# Patient Record
Sex: Female | Born: 1990 | Race: Black or African American | Hispanic: No | Marital: Single | Smoking: Never smoker
Health system: Southern US, Community
[De-identification: ages and names within clinical notes are randomized; demographics above are authoritative.]

---
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
## 2013-11-17 LAB — HM HIV SCREENING LAB: HM HIV Screening: NEGATIVE

## 2015-11-11 ENCOUNTER — Encounter: Payer: Self-pay | Admitting: Emergency Medicine

## 2015-11-11 ENCOUNTER — Emergency Department
Admission: EM | Admit: 2015-11-11 | Discharge: 2015-11-11 | Disposition: A | Discharge status: Home / Self Care | Payer: Medicaid Other | Attending: Emergency Medicine | Admitting: Emergency Medicine

## 2015-11-11 DIAGNOSIS — O26892 Other specified pregnancy related conditions, second trimester: Secondary | ICD-10-CM | POA: Insufficient documentation

## 2015-11-11 DIAGNOSIS — Z3492 Encounter for supervision of normal pregnancy, unspecified, second trimester: Secondary | ICD-10-CM

## 2015-11-11 DIAGNOSIS — R1032 Left lower quadrant pain: Secondary | ICD-10-CM | POA: Insufficient documentation

## 2015-11-11 DIAGNOSIS — Z3A14 14 weeks gestation of pregnancy: Secondary | ICD-10-CM | POA: Insufficient documentation
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
## 2015-11-11 NOTE — Discharge Instructions (Signed)
Please follow up closely with obstetrics and gynecology or your primary doctor.  Return to the emergency room if you have vaginal bleeding, you become weak and dizzy or lightheaded, you have an episode of passing out, have abdominal pain, leak of fluid from the vaginal,, develop abdominal or pelvic pain, fevers chills or other new concerns arise.
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
## 2015-11-11 NOTE — ED Notes (Signed)
[phone]
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
## 2015-11-11 NOTE — ED Notes (Signed)
+   Fetal movement noted on ultrasound beside. HR 148

## 2016-07-25 LAB — HM PAP SMEAR: HM Pap smear: NEGATIVE

## 2017-04-28 ENCOUNTER — Other Ambulatory Visit: Payer: Self-pay

## 2017-04-28 ENCOUNTER — Encounter: Payer: Self-pay | Admitting: Emergency Medicine

## 2017-04-28 ENCOUNTER — Emergency Department: Payer: Medicaid Other

## 2017-04-28 ENCOUNTER — Emergency Department
Admission: EM | Admit: 2017-04-28 | Discharge: 2017-04-28 | Disposition: A | Discharge status: Home / Self Care | Payer: Medicaid Other | Attending: Emergency Medicine | Admitting: Emergency Medicine

## 2017-04-28 DIAGNOSIS — Y92410 Unspecified street and highway as the place of occurrence of the external cause: Secondary | ICD-10-CM | POA: Insufficient documentation

## 2017-04-28 DIAGNOSIS — Y9389 Activity, other specified: Secondary | ICD-10-CM | POA: Insufficient documentation

## 2017-04-28 DIAGNOSIS — Y998 Other external cause status: Secondary | ICD-10-CM | POA: Diagnosis not present

## 2017-04-28 DIAGNOSIS — S62611A Displaced fracture of proximal phalanx of left index finger, initial encounter for closed fracture: Secondary | ICD-10-CM | POA: Insufficient documentation

## 2017-04-28 DIAGNOSIS — S6992XA Unspecified injury of left wrist, hand and finger(s), initial encounter: Secondary | ICD-10-CM | POA: Diagnosis present

## 2017-04-28 MED ORDER — OXYCODONE-ACETAMINOPHEN 5-325 MG PO TABS
1.0000 | ORAL_TABLET | Freq: Once | ORAL | Status: AC
  Administered 2017-04-28: 1 via ORAL
  Filled 2017-04-28: qty 1

## 2017-04-28 MED ORDER — OXYCODONE-ACETAMINOPHEN 5-325 MG PO TABS: 1.0000 | ORAL_TABLET | Freq: Four times a day (QID) | ORAL | 0 refills | Status: AC | PRN
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
## 2017-04-29 DIAGNOSIS — S62611A Displaced fracture of proximal phalanx of left index finger, initial encounter for closed fracture: Secondary | ICD-10-CM | POA: Diagnosis not present

## 2017-08-06 DIAGNOSIS — Z0000 Encounter for general adult medical examination without abnormal findings: Secondary | ICD-10-CM | POA: Diagnosis not present

## 2017-08-06 DIAGNOSIS — Z3009 Encounter for other general counseling and advice on contraception: Secondary | ICD-10-CM | POA: Diagnosis not present

## 2017-08-06 DIAGNOSIS — Z3049 Encounter for surveillance of other contraceptives: Secondary | ICD-10-CM | POA: Diagnosis not present

## 2017-08-06 DIAGNOSIS — Z30013 Encounter for initial prescription of injectable contraceptive: Secondary | ICD-10-CM | POA: Diagnosis not present

## 2017-11-11 DIAGNOSIS — Z30013 Encounter for initial prescription of injectable contraceptive: Secondary | ICD-10-CM | POA: Diagnosis not present

## 2017-11-11 DIAGNOSIS — Z309 Encounter for contraceptive management, unspecified: Secondary | ICD-10-CM | POA: Diagnosis not present

## 2018-04-08 DIAGNOSIS — M7918 Myalgia, other site: Secondary | ICD-10-CM | POA: Diagnosis not present

## 2018-04-08 DIAGNOSIS — R1013 Epigastric pain: Secondary | ICD-10-CM | POA: Diagnosis not present

## 2018-04-15 ENCOUNTER — Emergency Department
Admission: EM | Admit: 2018-04-15 | Discharge: 2018-04-16 | Disposition: A | Discharge status: Home / Self Care | Payer: Medicaid Other | Attending: Emergency Medicine | Admitting: Emergency Medicine

## 2018-04-15 ENCOUNTER — Other Ambulatory Visit: Payer: Self-pay

## 2018-04-15 ENCOUNTER — Encounter: Payer: Self-pay | Admitting: *Deleted

## 2018-04-15 DIAGNOSIS — K529 Noninfective gastroenteritis and colitis, unspecified: Secondary | ICD-10-CM | POA: Insufficient documentation

## 2018-04-15 DIAGNOSIS — K6389 Other specified diseases of intestine: Secondary | ICD-10-CM | POA: Diagnosis not present

## 2018-04-15 DIAGNOSIS — R1084 Generalized abdominal pain: Secondary | ICD-10-CM | POA: Insufficient documentation

## 2018-04-15 DIAGNOSIS — R1013 Epigastric pain: Secondary | ICD-10-CM | POA: Diagnosis present

## 2018-04-15 LAB — COMPREHENSIVE METABOLIC PANEL
ALBUMIN: 3.7 g/dL (ref 3.5–5.0)
ALK PHOS: 44 U/L (ref 38–126)
ALT: 11 U/L (ref 0–44)
AST: 17 U/L (ref 15–41)
Anion gap: 10 (ref 5–15)
BUN: 10 mg/dL (ref 6–20)
CALCIUM: 8.7 mg/dL — AB (ref 8.9–10.3)
CO2: 29 mmol/L (ref 22–32)
Chloride: 99 mmol/L (ref 98–111)
Creatinine, Ser: 0.68 mg/dL (ref 0.44–1.00)
GFR calc Af Amer: >60 mL/min (ref >60)
GFR calc non Af Amer: >60 mL/min (ref >60)
GLUCOSE: 112 mg/dL — AB (ref 70–99)
POTASSIUM: 3.4 mmol/L — AB (ref 3.5–5.1)
Sodium: 138 mmol/L (ref 135–145)
TOTAL PROTEIN: 8 g/dL (ref 6.5–8.1)

## 2018-04-15 LAB — CBC
HCT: 38.6 % (ref 36.0–46.0)
Hemoglobin: 12.5 g/dL (ref 12.0–15.0)
MCH: 27.5 pg (ref 26.0–34.0)
MCHC: 32.4 g/dL (ref 30.0–36.0)
MCV: 84.8 fL (ref 80.0–100.0)
NRBC: 0 % (ref 0.0–0.2)
PLATELETS: 359 10*3/uL (ref 150–400)
RBC: 4.55 MIL/uL (ref 3.87–5.11)
RDW: 13.3 % (ref 11.5–15.5)
WBC: 17.4 10*3/uL — ABNORMAL HIGH (ref 4.0–10.5)

## 2018-04-15 LAB — URINALYSIS, COMPLETE (UACMP) WITH MICROSCOPIC
BILIRUBIN URINE: NEGATIVE
Bacteria, UA: NONE SEEN
GLUCOSE, UA: NEGATIVE mg/dL
HGB URINE DIPSTICK: NEGATIVE
Ketones, ur: NEGATIVE mg/dL
NITRITE: NEGATIVE
PH: 8 (ref 5.0–8.0)
Protein, ur: 30 mg/dL — AB
SPECIFIC GRAVITY, URINE: 1.027 (ref 1.005–1.030)

## 2018-04-15 LAB — POCT PREGNANCY, URINE: Preg Test, Ur: NEGATIVE

## 2018-04-15 LAB — LIPASE, BLOOD: Lipase: 21 U/L (ref 11–51)

## 2018-04-15 MED ORDER — FENTANYL CITRATE (PF) 100 MCG/2ML IJ SOLN
50.0000 ug | Freq: Once | INTRAMUSCULAR | Status: AC
  Administered 2018-04-15: 50 ug via INTRAVENOUS
  Filled 2018-04-15: qty 2

## 2018-04-15 MED ORDER — IOHEXOL 240 MG/ML SOLN
50.0000 mL | Freq: Once | INTRAMUSCULAR | Status: AC | PRN
  Administered 2018-04-16: 50 mL via ORAL

## 2018-04-15 MED ORDER — ONDANSETRON HCL 4 MG/2ML IJ SOLN
4.0000 mg | Freq: Once | INTRAMUSCULAR | Status: AC
  Administered 2018-04-15: 4 mg via INTRAVENOUS
  Filled 2018-04-15: qty 2

## 2018-04-15 MED ORDER — SODIUM CHLORIDE 0.9 % IV BOLUS
1000.0000 mL | Freq: Once | INTRAVENOUS | Status: AC
Start: 2018-04-15 — End: 2018-04-16
  Administered 2018-04-15: 1000 mL via INTRAVENOUS

## 2018-04-15 MED ORDER — SODIUM CHLORIDE 0.9% FLUSH
3.0000 mL | Freq: Once | INTRAVENOUS | Status: AC
  Administered 2018-04-15: 3 mL via INTRAVENOUS

## 2018-04-15 NOTE — ED Triage Notes (Signed)
Pt to triage via wheelchair.  Pt has abd pain.  No n/v/d.  No back pain.  No vag bleeding.  No urinary sx. Pt tearful

## 2018-04-15 NOTE — ED Provider Notes (Signed)
[facility] Emergency Department Provider Note   ____________________________________________   First MD Initiated Contact with Patient 04/15/18 2314     (approximate)  I have reviewed the triage vital signs and the nursing notes.   HISTORY  Chief Complaint Abdominal Pain    HPI Catherine Watkins is a 28 y.o. female who presents to the ED from home with a chief complaint of abdominal pain.  Patient reports onset of epigastric pain 2.5 weeks ago.  Has had waxing/waning pain which is now diffuse, particularly in the lower quadrants.  Patient endorses chills, no fever.  Denies associated cough, congestion, chest pain, shortness of breath, nausea, vomiting, dysuria, diarrhea.  Denies vaginal bleeding.  Has had some vaginal discharge which she feels is not more than usual.  Presents tonight secondary to increased pain.  Has not been traveling nor exposed to persons diagnosed with coronavirus.  Denies recent trauma.     Past medical history None  There are no active problems to display for this patient.   Past surgical history Finger surgery  Prior to Admission medications   Not on File    Allergies Patient has no known allergies.  No family history on file.  Social History Social History   Tobacco Use   Smoking status: Never Smoker   Smokeless tobacco: Never Used  Substance Use Topics   Alcohol use: Yes    Frequency: Never   Drug use: Never    Review of Systems  Constitutional: No fever/chills Eyes: No visual changes. ENT: No sore throat. Cardiovascular: Denies chest pain. Respiratory: Denies shortness of breath. Gastrointestinal: Positive for abdominal pain.  No nausea, no vomiting.  No diarrhea.  No constipation. Genitourinary: Negative for dysuria. Musculoskeletal: Negative for back pain. Skin: Negative for rash. Neurological: Negative for headaches, focal weakness or  numbness.   ____________________________________________   PHYSICAL EXAM:  VITAL SIGNS: ED Triage Vitals  Enc Vitals Group     BP 04/15/18 2159 129/87     Pulse Rate 04/15/18 2159 90     Resp 04/15/18 2159 (!) 22     Temp 04/15/18 2159 100 F (37.8 C)     Temp Source 04/15/18 2159 Oral     SpO2 04/15/18 2159 100 %     Weight 04/15/18 2200 124 lb (56.2 kg)     Height 04/15/18 2200 5\' 9"  (1.753 m)     Head Circumference --      Peak Flow --      Pain Score 04/15/18 2159 10     Pain Loc --      Pain Edu? --      Excl. in GC? --     Constitutional: Alert and oriented. Well appearing and in mild to moderate acute distress. Eyes: Conjunctivae are normal. PERRL. EOMI. Head: Atraumatic. Nose: No congestion/rhinnorhea. Mouth/Throat: Mucous membranes are moist.  Oropharynx non-erythematous. Neck: No stridor.   Cardiovascular: Normal rate, regular rhythm. Grossly normal heart sounds.  Good peripheral circulation. Respiratory: Normal respiratory effort.  No retractions. Lungs CTAB. Gastrointestinal: Soft and diffusely tender to palpation with voluntary guarding, no rebound. No distention. No abdominal bruits. No CVA tenderness. Musculoskeletal: No lower extremity tenderness nor edema.  No joint effusions. Neurologic:  Normal speech and language. No gross focal neurologic deficits are appreciated. No gait instability. Skin:  Skin is warm, dry and intact. No rash noted.  No petechiae. Psychiatric: Mood and affect are normal. Speech and behavior are normal.  ____________________________________________   LABS (all labs ordered  are listed, but only abnormal results are displayed)  Labs Reviewed  COMPREHENSIVE METABOLIC PANEL - Abnormal; Notable for the following components:      Result Value   Potassium 3.4 (*)    Glucose, Bld 112 (*)    Calcium 8.7 (*)    Total Bilirubin <0.1 (*)    All other components within normal limits  CBC - Abnormal; Notable for the following  components:   WBC 17.4 (*)    All other components within normal limits  URINALYSIS, COMPLETE (UACMP) WITH MICROSCOPIC - Abnormal; Notable for the following components:   Color, Urine YELLOW (*)    APPearance HAZY (*)    Protein, ur 30 (*)    Leukocytes,Ua TRACE (*)    All other components within normal limits  LIPASE, BLOOD  POC URINE PREG, ED  POCT PREGNANCY, URINE   ____________________________________________  EKG  None ____________________________________________  RADIOLOGY  ED MD interpretation: Enteritis  Official radiology report(s): Ct Abdomen Pelvis W Contrast  Result Date: 04/16/2018 CLINICAL DATA:  Diffuse abdominal pain and fever. Elevated white blood cell count. EXAM: CT ABDOMEN AND PELVIS WITH CONTRAST TECHNIQUE: Multidetector CT imaging of the abdomen and pelvis was performed using the standard protocol following bolus administration of intravenous contrast. CONTRAST:  53mL OMNIPAQUE IOHEXOL 300 MG/ML  SOLN COMPARISON:  None. FINDINGS: Lower chest: Mild linear patchy atelectasis in the right lower lobe. No pleural fluid. Hepatobiliary: No focal liver abnormality is seen. No gallstones, gallbladder wall thickening, or biliary dilatation. Pancreas: No ductal dilatation or inflammation. Spleen: Normal in size without focal abnormality. Adrenals/Urinary Tract: Normal adrenal glands. No hydronephrosis or perinephric edema. Homogeneous renal enhancement. Urinary bladder is nondistended and not well evaluated. Stomach/Bowel: Stomach physiologically distended and unremarkable. Moderate length segment of small bowel wall thickening involving bowel loops in the pelvis. Moderate length segment of small bowel wall thickening involving lower abdominal bowel loops. Probable additional small bowel wall thickening involving pelvic bowel loops, less well-defined as enteric contrast has not reached distally. Mild associated mesenteric edema and pelvic fat stranding. The appendix is normal.  No significant terminal ileal inflammation. Moderate stool throughout the colon. No evidence of colonic wall thickening or inflammatory change. Vascular/Lymphatic: No significant vascular findings are present. No enlarged abdominal or pelvic lymph nodes. Reproductive: The uterus is unremarkable. Adnexa not well-defined but no evidence of adnexal mass. Other: Generalized fat stranding involving pelvic fat. No definite free fluid. No free air. Musculoskeletal: There are no acute or suspicious osseous abnormalities. IMPRESSION: 1. Moderate length segment small bowel wall thickening involving the lower abdomen most consistent with enteritis, this is likely infectious or inflammatory. 2. Suspect additional small bowel thickening involving pelvic bowel loops, not as well defined as enteric contrast has not yet progressed to this region. Generalized fat stranding of the pelvis is likely secondary to bowel inflammation. If there is clinical concern for pelvic inflammatory disease, consider pelvic ultrasound for further evaluation. Electronically Signed   By: Narda Rutherford M.D.   On: 04/16/2018 01:00    ____________________________________________   PROCEDURES  Procedure(s) performed (including Critical Care):  Procedures  CRITICAL CARE Performed by: Irean Hong   Total critical care time: 30 minutes  Critical care time was exclusive of separately billable procedures and treating other patients.  Critical care was necessary to treat or prevent imminent or life-threatening deterioration.  Critical care was time spent personally by me on the following activities: development of treatment plan with patient and/or surrogate as well as nursing, discussions with consultants, evaluation  of patient's response to treatment, examination of patient, obtaining history from patient or surrogate, ordering and performing treatments and interventions, ordering and review of laboratory studies, ordering and review of  radiographic studies, pulse oximetry and re-evaluation of patient's condition. ____________________________________________   INITIAL IMPRESSION / ASSESSMENT AND PLAN / ED COURSE  As part of my medical decision making, I reviewed the following data within the electronic MEDICAL RECORD NUMBER Nursing notes reviewed and incorporated, Labs reviewed, Old chart reviewed, Radiograph reviewed and Notes from prior ED visits     28 year old female who presents with diffuse abdominal pain. Differential diagnosis includes, but is not limited to, ovarian cyst, ovarian torsion, acute appendicitis, diverticulitis, urinary tract infection/pyelonephritis, endometriosis, bowel obstruction, colitis, renal colic, gastroenteritis, hernia, fibroids, endometriosis, pregnancy related pain including ectopic pregnancy, etc.  Laboratory results remarkable for leukocytosis.  Will administer 50 mcg IV fentanyl for pain, paired with 4 mg IV Zofran for nausea.  Initiate IV fluid resuscitation.  Proceed with CT abdomen/pelvis to evaluate for intra-abdominal etiology of patient's symptoms.   Clinical Course as of Apr 15 124  Thu Apr 16, 2018  0123 Updated patient on CT imaging result.  She denies pelvic complaints and is not concerned for STDs.  Will treat with Augmentin.  We will also discharge home with prescriptions for Percocet and Zofran to use as needed.  Strict return precautions given.  Patient verbalizes understanding agrees with plan of care.   [JS]    Clinical Course User Index [JS] Irean Hong, MD     ____________________________________________   FINAL CLINICAL IMPRESSION(S) / ED DIAGNOSES  Final diagnoses:  Generalized abdominal pain  Enteritis     ED Discharge Orders    None       Note:  This document was prepared using Dragon voice recognition software and may include unintentional dictation errors.   Irean Hong, MD 04/16/18 [phone]

## 2018-04-15 NOTE — ED Notes (Signed)
Pt with temp, WBC 17.4, diffuse tenderness with palpation, increased to rt lower abd; charge nurse notified for next exam room

## 2018-04-16 ENCOUNTER — Emergency Department: Payer: Medicaid Other

## 2018-04-16 ENCOUNTER — Encounter: Payer: Self-pay | Admitting: Radiology

## 2018-04-16 DIAGNOSIS — K6389 Other specified diseases of intestine: Secondary | ICD-10-CM | POA: Diagnosis not present

## 2018-04-16 MED ORDER — ONDANSETRON 4 MG PO TBDP: 4.0000 mg | ORAL_TABLET | Freq: Three times a day (TID) | ORAL | 0 refills | Status: AC | PRN

## 2018-04-16 MED ORDER — OXYCODONE-ACETAMINOPHEN 5-325 MG PO TABS
1.0000 | ORAL_TABLET | Freq: Once | ORAL | Status: AC
  Administered 2018-04-16: 1 via ORAL
  Filled 2018-04-16: qty 1

## 2018-04-16 MED ORDER — IOHEXOL 300 MG/ML  SOLN
75.0000 mL | Freq: Once | INTRAMUSCULAR | Status: AC | PRN
  Administered 2018-04-16: 75 mL via INTRAVENOUS

## 2018-04-16 MED ORDER — AMOXICILLIN-POT CLAVULANATE 875-125 MG PO TABS
1.0000 | ORAL_TABLET | Freq: Once | ORAL | Status: AC
  Administered 2018-04-16: 1 via ORAL
  Filled 2018-04-16: qty 1

## 2018-04-16 MED ORDER — OXYCODONE-ACETAMINOPHEN 5-325 MG PO TABS: 1.0000 | ORAL_TABLET | ORAL | 0 refills | Status: AC | PRN

## 2018-04-16 MED ORDER — ONDANSETRON 4 MG PO TBDP
4.0000 mg | ORAL_TABLET | Freq: Once | ORAL | Status: AC
  Administered 2018-04-16: 4 mg via ORAL
  Filled 2018-04-16: qty 1

## 2018-04-16 MED ORDER — AMOXICILLIN-POT CLAVULANATE 875-125 MG PO TABS: 1.0000 | ORAL_TABLET | Freq: Two times a day (BID) | ORAL | 0 refills | Status: AC

## 2018-04-16 NOTE — Discharge Instructions (Signed)
1.  Take antibiotic as prescribed (Augmentin 875 mg twice daily x7 days). 2.  You may take pain and nausea medicines as needed (Percocet/Zofran #30). 3.  Eat a bland diet x5 days, then slowly advance diet as tolerated. 4.  Return to the ER for worsening symptoms, persistent vomiting, difficulty breathing or other concerns.

## 2018-04-16 NOTE — ED Notes (Signed)
Patient transported to CT 

## 2018-04-20 DIAGNOSIS — R109 Unspecified abdominal pain: Secondary | ICD-10-CM | POA: Diagnosis not present

## 2018-04-20 DIAGNOSIS — A049 Bacterial intestinal infection, unspecified: Secondary | ICD-10-CM | POA: Diagnosis not present

## 2018-04-20 DIAGNOSIS — R1084 Generalized abdominal pain: Secondary | ICD-10-CM | POA: Diagnosis not present

## 2018-04-23 DIAGNOSIS — R933 Abnormal findings on diagnostic imaging of other parts of digestive tract: Secondary | ICD-10-CM | POA: Diagnosis not present

## 2018-04-23 DIAGNOSIS — R110 Nausea: Secondary | ICD-10-CM | POA: Diagnosis not present

## 2018-04-23 DIAGNOSIS — K529 Noninfective gastroenteritis and colitis, unspecified: Secondary | ICD-10-CM | POA: Diagnosis not present

## 2018-04-23 DIAGNOSIS — R1031 Right lower quadrant pain: Secondary | ICD-10-CM | POA: Diagnosis not present

## 2019-07-29 ENCOUNTER — Ambulatory Visit: Payer: Self-pay

## 2020-02-11 IMAGING — CT CT ABDOMEN AND PELVIS WITH CONTRAST
2 of 4 series · 15 of 46 positions shown, 17 images · IV contrast (omnipaque)
Comparison: None.

CLINICAL DATA: Diffuse abdominal pain and fever. Elevated white
blood cell count.

EXAM:
CT ABDOMEN AND PELVIS WITH CONTRAST
TECHNIQUE: Multidetector CT imaging of the abdomen and pelvis was performed
using the standard protocol following bolus administration of
intravenous contrast.
CONTRAST:  75mL OMNIPAQUE IOHEXOL 300 MG/ML  SOLN

[Series 2: axial st · axial · 0.62mm/px · z∈[-916,-536]mm · 12 of 90 slices shown, 14 images]
[im 7/90  soft-tissue]
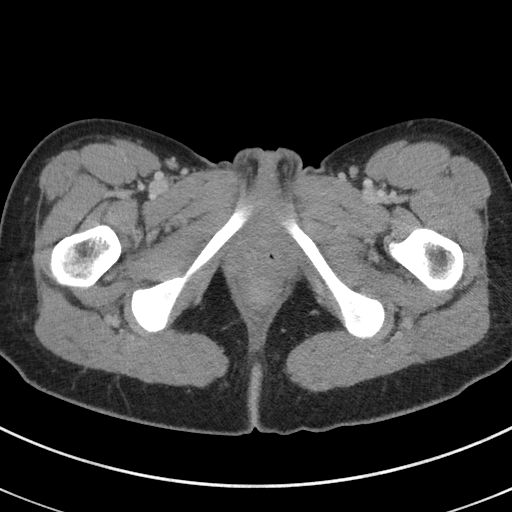
[im 7/90  bone]
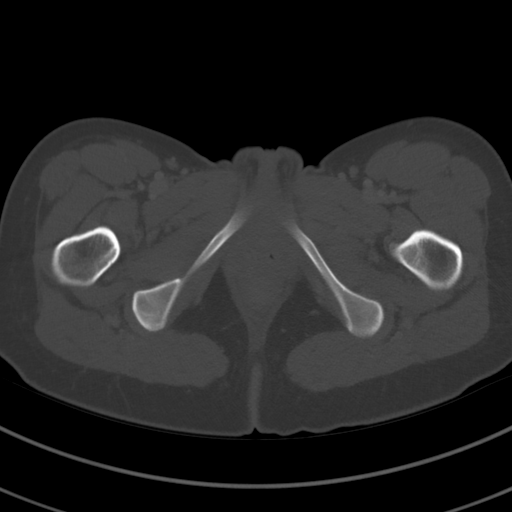
[im 14/90  soft-tissue]
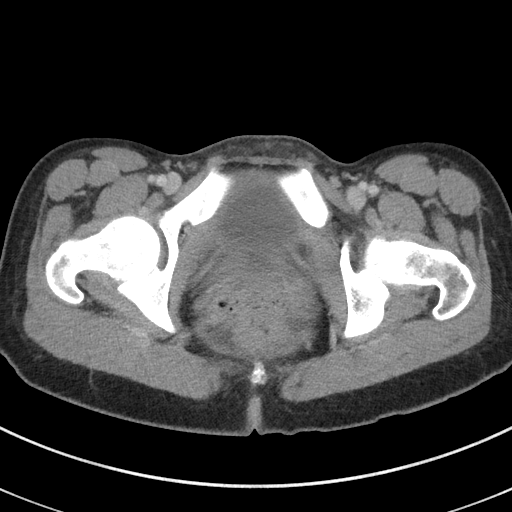
[im 21/90  soft-tissue]
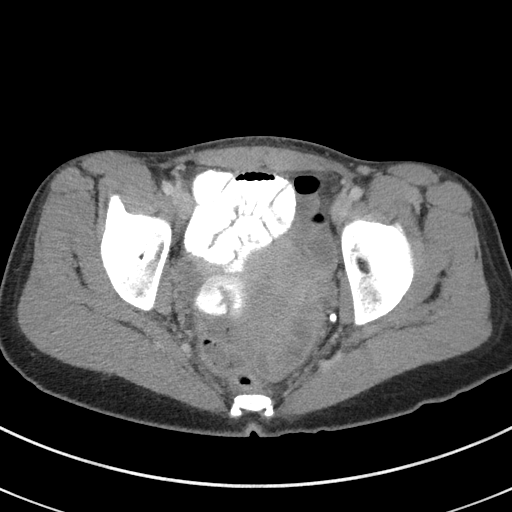
[im 28/90  soft-tissue]
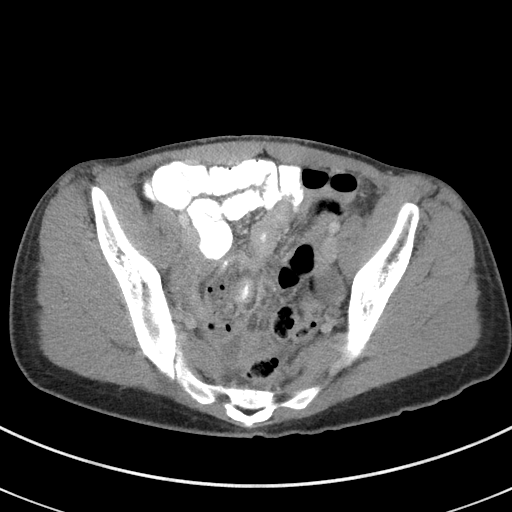
[im 35/90  soft-tissue]
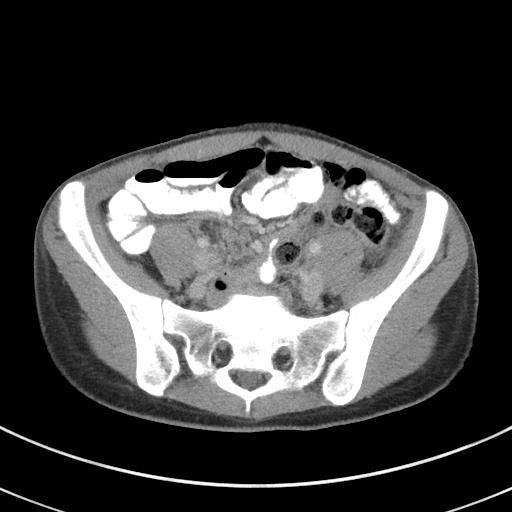
[im 42/90  soft-tissue]
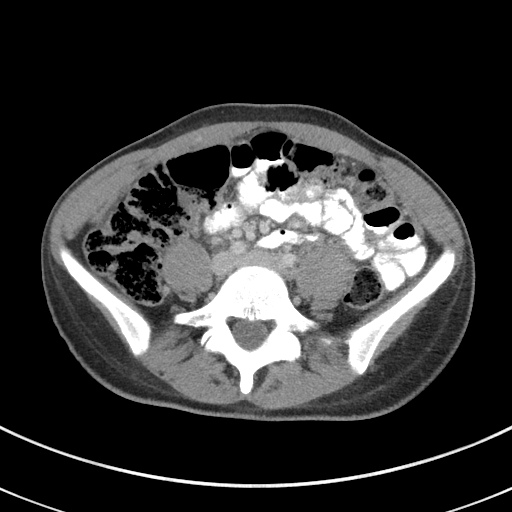
[im 48/90  soft-tissue]
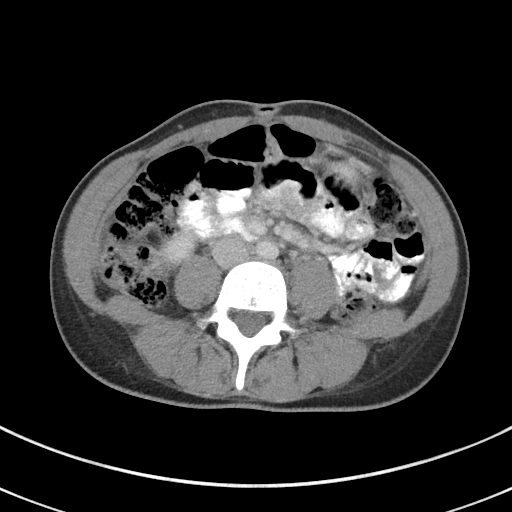
[im 55/90  soft-tissue]
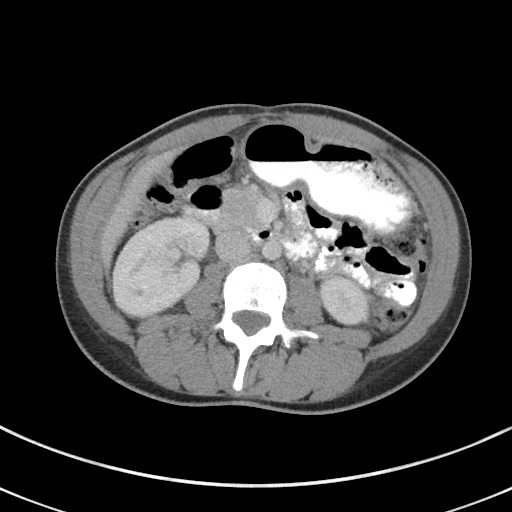
[im 62/90  soft-tissue]
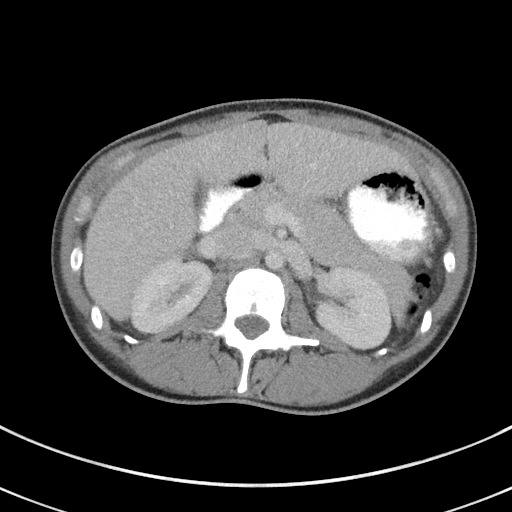
[im 62/90  bone]
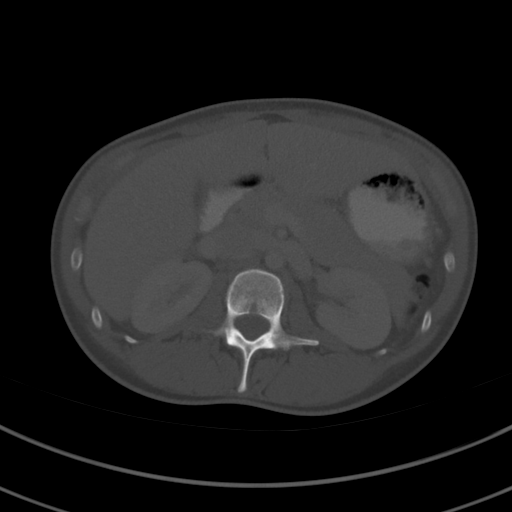
[im 69/90  soft-tissue]
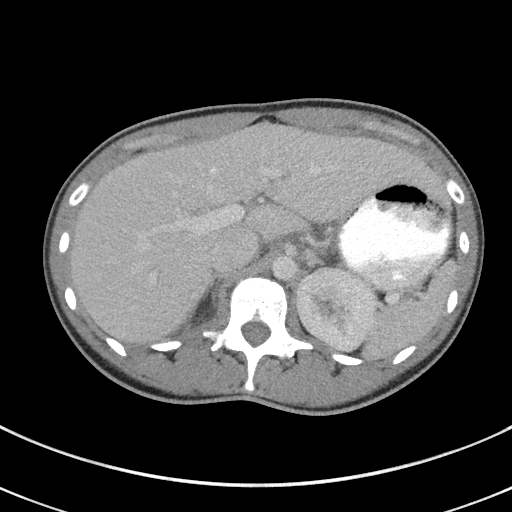
[im 76/90  soft-tissue]
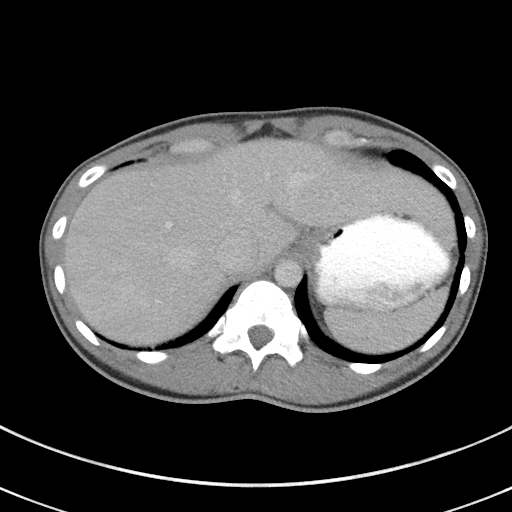
[im 83/90  soft-tissue]
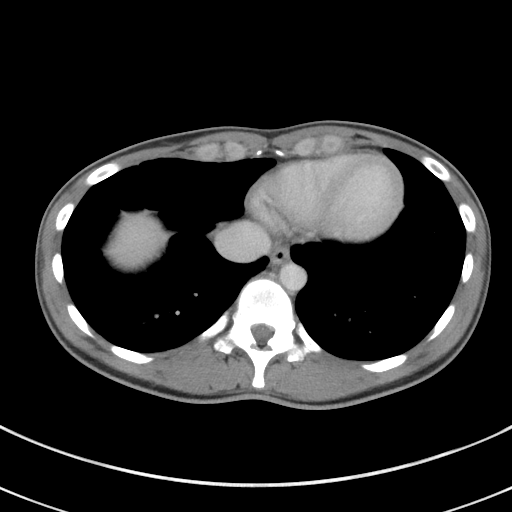

[Series 5: coronal st · coronal · 0.67mm/px · 3 of 68 slices shown]
[im 23/68  soft-tissue]
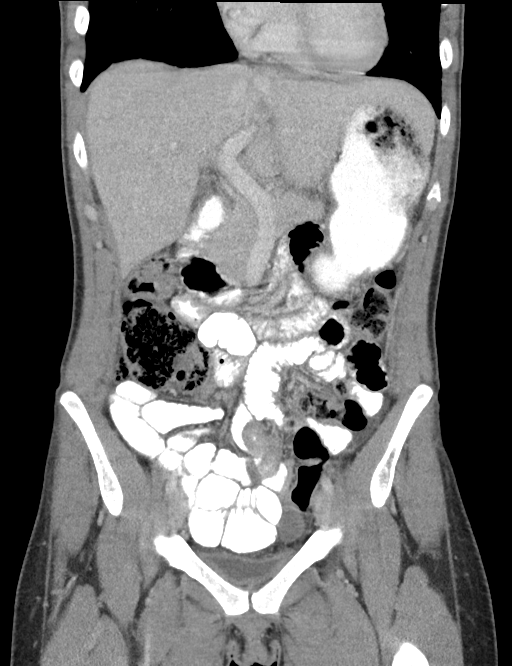
[im 30/68  soft-tissue]
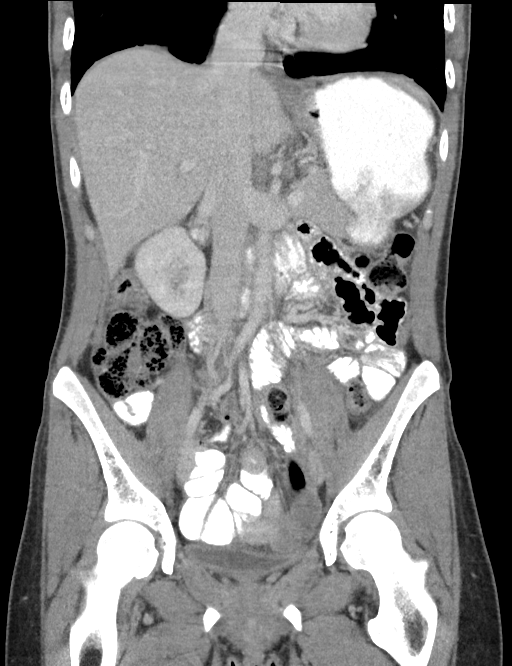
[im 38/68  soft-tissue]
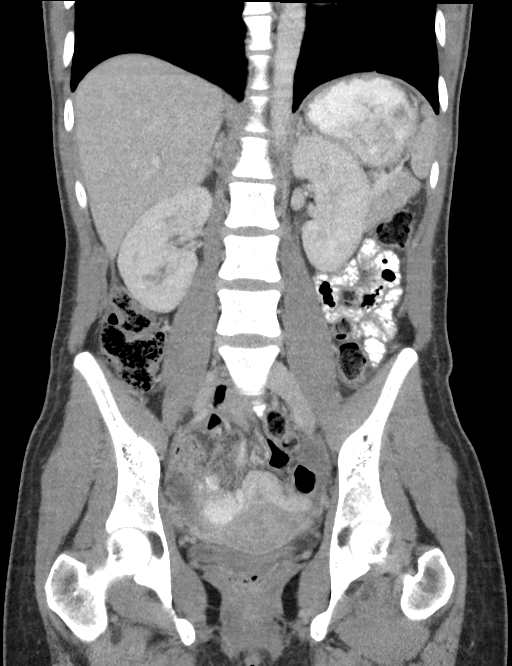

[15 of 46 positions shown; findings below may reference images not displayed]

FINDINGS: Lower chest: Mild linear patchy atelectasis in the right lower lobe.
No pleural fluid.

Hepatobiliary: No focal liver abnormality is seen. No gallstones,
gallbladder wall thickening, or biliary dilatation.

Pancreas: No ductal dilatation or inflammation.

Spleen: Normal in size without focal abnormality.

Adrenals/Urinary Tract: Normal adrenal glands. No hydronephrosis or
perinephric edema. Homogeneous renal enhancement. Urinary bladder is
nondistended and not well evaluated.

Stomach/Bowel: Stomach physiologically distended and unremarkable.
Moderate length segment of small bowel wall thickening involving
bowel loops in the pelvis. Moderate length segment of small bowel
wall thickening involving lower abdominal bowel loops. Probable
additional small bowel wall thickening involving pelvic bowel loops,
less well-defined as enteric contrast has not reached distally. Mild
associated mesenteric edema and pelvic fat stranding. The appendix
is normal. No significant terminal ileal inflammation. Moderate
stool throughout the colon. No evidence of colonic wall thickening
or inflammatory change.

Vascular/Lymphatic: No significant vascular findings are present. No
enlarged abdominal or pelvic lymph nodes.

Reproductive: The uterus is unremarkable. Adnexa not well-defined
but no evidence of adnexal mass.

Other: Generalized fat stranding involving pelvic fat. No definite
free fluid. No free air.

Musculoskeletal: There are no acute or suspicious osseous
abnormalities.
IMPRESSION: 1. Moderate length segment small bowel wall thickening involving the
lower abdomen most consistent with enteritis, this is likely
infectious or inflammatory.
2. Suspect additional small bowel thickening involving pelvic bowel
loops, not as well defined as enteric contrast has not yet
progressed to this region. Generalized fat stranding of the pelvis
is likely secondary to bowel inflammation. If there is clinical
concern for pelvic inflammatory disease, consider pelvic ultrasound
for further evaluation.

## 2021-10-25 ENCOUNTER — Ambulatory Visit
Admission: EM | Admit: 2021-10-25 | Discharge: 2021-10-25 | Disposition: A | Discharge status: Home / Self Care | Payer: Medicaid Other

## 2021-10-25 DIAGNOSIS — M25562 Pain in left knee: Secondary | ICD-10-CM

## 2021-10-25 DIAGNOSIS — M25561 Pain in right knee: Secondary | ICD-10-CM | POA: Diagnosis not present

## 2021-10-25 MED ORDER — IBUPROFEN 800 MG PO TABS: 800.0000 mg | ORAL_TABLET | Freq: Three times a day (TID) | ORAL | 0 refills | Status: AC | PRN

## 2021-10-25 NOTE — ED Provider Notes (Signed)
[facility]    CSN: [id number] Arrival date & time: 10/25/21  1813      History   Chief Complaint Chief Complaint  Patient presents with   Knee Pain    Entered by patient    HPI Catherine Watkins is a 31 y.o. female.   The history is provided by the patient.   Patient presents for complaints of bilateral knee pain that is been present for the past 2 to 3 years.  Patient states over the past week, she has noticed increased pain in her knees, with worsening pain in the right knee.  She states symptoms worsen with prolonged sitting.  She denies swelling, radiation of pain, numbness, tingling, popping, or locking.  She also denies any known injury or trauma.  Patient states that she has been taking Advil 200 mg as needed to help with pain.  She states that she became concerned because her pain worsened to the point this week where it made her stop what she was doing.  Patient states her mother and sister have knee problems as well.  Patient states that she ordered a brace from [employer] approximately 1 year ago and has been using it intermittently since that time.  History reviewed. No pertinent past medical history.  There are no problems to display for this patient.   History reviewed. No pertinent surgical history.  OB History     Gravida  1   Para      Term      Preterm      AB      Living         SAB      IAB      Ectopic      Multiple      Live Births               Home Medications    Prior to Admission medications   Medication Sig Start Date End Date Taking? Authorizing Provider  ibuprofen (ADVIL) 800 MG tablet Take 1 tablet (800 mg total) by mouth every 8 (eight) hours as needed. 10/25/21  Yes Lachelle Rissler-Warren, Sadie Haber, NP  Multiple Vitamin (MULTIVITAMIN) tablet Take 1 tablet by mouth daily.   Yes [provider]  amoxicillin-clavulanate (AUGMENTIN) 875-125 MG tablet Take 1 tablet by mouth 2 (two) times daily. 04/16/18   Irean Hong, MD  ondansetron (ZOFRAN ODT) 4 MG disintegrating tablet Take 1 tablet (4 mg total) by mouth every 8 (eight) hours as needed for nausea or vomiting. 04/16/18   Irean Hong, MD  oxyCODONE-acetaminophen (PERCOCET/ROXICET) 5-325 MG tablet Take 1 tablet by mouth every 4 (four) hours as needed for severe pain. 04/16/18   Irean Hong, MD    Family History Family History  Problem Relation Age of Onset   Diabetes Maternal Grandmother    Diabetes Maternal Grandfather    Diabetes Paternal Grandmother    Hypertension Paternal Grandmother    Hypertension Paternal Grandfather     Social History Social History   Tobacco Use   Smoking status: Never   Smokeless tobacco: Never  Substance Use Topics   Alcohol use: Yes   Drug use: Never     Allergies   Patient has no known allergies.   Review of Systems Review of Systems Per HPI  Physical Exam Triage Vital Signs ED Triage Vitals  Enc Vitals Group     BP 10/25/21 1824 127/80     Pulse Rate 10/25/21 1824 90  Resp 10/25/21 1824 16     Temp 10/25/21 1824 99.7 F (37.6 C)     Temp Source 10/25/21 1824 Oral     SpO2 10/25/21 1824 97 %     Weight --      Height --      Head Circumference --      Peak Flow --      Pain Score 10/25/21 1825 9     Pain Loc --      Pain Edu? --      Excl. in GC? --    No data found.  Updated Vital Signs BP 127/80 (BP Location: Right Arm)   Pulse 90   Temp 99.7 F (37.6 C) (Oral)   Resp 16   LMP  (Within Weeks) Comment: 1 week  SpO2 97%   Breastfeeding No   Visual Acuity Right Eye Distance:   Left Eye Distance:   Bilateral Distance:    Right Eye Near:   Left Eye Near:    Bilateral Near:     Physical Exam Vitals and nursing note reviewed.  Constitutional:      General: She is not in acute distress.    Appearance: Normal appearance.  HENT:     Head: Normocephalic.  Eyes:     Extraocular Movements: Extraocular movements intact.     Pupils: Pupils are equal, round, and reactive  to light.  Pulmonary:     Effort: Pulmonary effort is normal.  Musculoskeletal:     Cervical back: Normal range of motion.     Right knee: No swelling, deformity or erythema. Normal range of motion. Tenderness present over the medial joint line.     Left knee: No swelling or deformity. Normal range of motion.  Skin:    General: Skin is warm and dry.  Neurological:     General: No focal deficit present.     Mental Status: She is alert and oriented to person, place, and time.  Psychiatric:        Mood and Affect: Mood normal.        Behavior: Behavior normal.      UC Treatments / Results  Labs (all labs ordered are listed, but only abnormal results are displayed) Labs Reviewed - No data to display  EKG   Radiology No results found.  Procedures Procedures (including critical care time)  Medications Ordered in UC Medications - No data to display  Initial Impression / Assessment and Plan / UC Course  I have reviewed the triage vital signs and the nursing notes.  Pertinent labs & imaging results that were available during my care of the patient were reviewed by me and considered in my medical decision making (see chart for details).  Patient presents for complaints of chronic knee pain that worsened over the past week.  On exam, patient does not have any obvious swelling, deformity, erythema, or ecchymosis.  She does have full range of motion to her knees.  She does have tenderness to the right knee at the medial joint line.  Difficult to determine cause of patient's symptoms.  Patient declines x-rays as she is sure she has no fracture or dislocation.  Discussed possible causes of the patient's knee pain to include osteoarthritis or joint effusion.  Patient was prescribed ibuprofen to help with her pain.  Patient was given recommendations for RICE therapy.  Patient was advised to use the knee brace that she has as needed.  Patient was further advised that because of the  chronicity  of her symptoms, that she may need to follow-up with orthopedics for further evaluation.  Patient was in agreement with this plan of care.  Patient was given the information for [facility] [place] [facility] and for [facility].  Patient verbalizes understanding.  All questions were answered. Final Clinical Impressions(s) / UC Diagnoses   Final diagnoses:  Pain in both knees, unspecified chronicity     Discharge Instructions      Take medication as prescribed.  Recommend Tumeric, which is an anti-inflammatory, which may help with pain. Apply ice for pain and swelling. Apply for 20 minutes, remove for 1 hour, then repeat. Gentle stretching and ROM exercises to help with pain. Alternate sitting and standing. Try to alternate every hour when possible. If symptoms do not improve or suddenly worsen, follow up with orthopedics. You can follow-up with [facility] at [phone] or [facility] at [phone]. Follow-up as needed.      ED Prescriptions     Medication Sig Dispense Auth. Provider   ibuprofen (ADVIL) 800 MG tablet Take 1 tablet (800 mg total) by mouth every 8 (eight) hours as needed. 30 tablet Ruari Mudgett-Warren, Alda Lea, NP      PDMP not reviewed this encounter.   Tish Men, NP 10/25/21 1901

## 2021-10-25 NOTE — Discharge Instructions (Addendum)
Take medication as prescribed.  Recommend Tumeric, which is an anti-inflammatory, which may help with pain. Apply ice for pain and swelling. Apply for 20 minutes, remove for 1 hour, then repeat. Gentle stretching and ROM exercises to help with pain. Alternate sitting and standing. Try to alternate every hour when possible. If symptoms do not improve or suddenly worsen, follow up with orthopedics. You can follow-up with [facility] at [phone] or [facility] at [phone]. Follow-up as needed.

## 2021-10-25 NOTE — ED Triage Notes (Signed)
Pt reports bilateral knee pain x 2-3 years. Pt reports knees feeling rubbing, pain is worse when slitting for extended period of time and tried to stand up. Pt think can be genetic, as her mother and sister has knee problems. Advil gives some relief.
